# Patient Record
Sex: Female | Born: 1975 | Race: White | Hispanic: No | Marital: Married | Smoking: Never smoker
Health system: Southern US, Community
[De-identification: ages and names within clinical notes are randomized; demographics above are authoritative.]

## PROBLEM LIST (undated history)

## (undated) DIAGNOSIS — F909 Attention-deficit hyperactivity disorder, unspecified type: Secondary | ICD-10-CM

## (undated) HISTORY — PX: KIDNEY SURGERY: SHX687

---

## 2003-12-26 ENCOUNTER — Ambulatory Visit (HOSPITAL_COMMUNITY): Admission: AD | Admit: 2003-12-26 | Discharge: 2003-12-26 | Payer: Self-pay | Admitting: Obstetrics and Gynecology

## 2004-01-16 ENCOUNTER — Ambulatory Visit (HOSPITAL_COMMUNITY): Admission: AD | Admit: 2004-01-16 | Discharge: 2004-01-16 | Payer: Self-pay | Admitting: Obstetrics and Gynecology

## 2004-01-22 ENCOUNTER — Inpatient Hospital Stay (HOSPITAL_COMMUNITY): Admission: AD | Admit: 2004-01-22 | Discharge: 2004-01-26 | Payer: Self-pay | Admitting: Obstetrics and Gynecology

## 2004-08-07 ENCOUNTER — Ambulatory Visit: Payer: Self-pay | Admitting: Psychiatry

## 2004-08-11 ENCOUNTER — Ambulatory Visit: Payer: Self-pay | Admitting: Psychiatry

## 2007-01-26 ENCOUNTER — Ambulatory Visit (HOSPITAL_COMMUNITY): Admission: RE | Admit: 2007-01-26 | Discharge: 2007-01-26 | Payer: Self-pay | Admitting: Obstetrics and Gynecology

## 2016-04-29 ENCOUNTER — Emergency Department (HOSPITAL_COMMUNITY)
Admission: EM | Admit: 2016-04-29 | Discharge: 2016-04-29 | Disposition: A | Discharge status: Home / Self Care | Payer: Self-pay | Attending: Emergency Medicine | Admitting: Emergency Medicine

## 2016-04-29 ENCOUNTER — Encounter (HOSPITAL_COMMUNITY): Payer: Self-pay | Admitting: Emergency Medicine

## 2016-04-29 ENCOUNTER — Other Ambulatory Visit: Payer: Self-pay

## 2016-04-29 ENCOUNTER — Emergency Department (HOSPITAL_COMMUNITY): Payer: Self-pay

## 2016-04-29 DIAGNOSIS — B029 Zoster without complications: Secondary | ICD-10-CM | POA: Insufficient documentation

## 2016-04-29 LAB — CBC WITH DIFFERENTIAL/PLATELET
BASOS PCT: 0 %
Basophils Absolute: 0 10*3/uL (ref 0.0–0.1)
EOS ABS: 0.1 10*3/uL (ref 0.0–0.7)
EOS PCT: 1 %
HCT: 44 % (ref 36.0–46.0)
HEMOGLOBIN: 14.7 g/dL (ref 12.0–15.0)
LYMPHS ABS: 2.2 10*3/uL (ref 0.7–4.0)
Lymphocytes Relative: 33 %
MCH: 31.4 pg (ref 26.0–34.0)
MCHC: 33.4 g/dL (ref 30.0–36.0)
MCV: 94 fL (ref 78.0–100.0)
MONOS PCT: 5 %
Monocytes Absolute: 0.4 10*3/uL (ref 0.1–1.0)
NEUTROS PCT: 61 %
Neutro Abs: 4 10*3/uL (ref 1.7–7.7)
PLATELETS: 257 10*3/uL (ref 150–400)
RBC: 4.68 MIL/uL (ref 3.87–5.11)
RDW: 12.6 % (ref 11.5–15.5)
WBC: 6.7 10*3/uL (ref 4.0–10.5)

## 2016-04-29 LAB — BASIC METABOLIC PANEL
Anion gap: 5 (ref 5–15)
BUN: 20 mg/dL (ref 6–20)
CALCIUM: 8.7 mg/dL — AB (ref 8.9–10.3)
CO2: 25 mmol/L (ref 22–32)
CREATININE: 0.76 mg/dL (ref 0.44–1.00)
Chloride: 107 mmol/L (ref 101–111)
Glucose, Bld: 92 mg/dL (ref 65–99)
Potassium: 4.4 mmol/L (ref 3.5–5.1)
SODIUM: 137 mmol/L (ref 135–145)

## 2016-04-29 LAB — I-STAT TROPONIN, ED: TROPONIN I, POC: 0 ng/mL (ref 0.00–0.08)

## 2016-04-29 MED ORDER — IBUPROFEN 400 MG PO TABS
600.0000 mg | ORAL_TABLET | Freq: Once | ORAL | Status: AC
  Administered 2016-04-29: 600 mg via ORAL
  Filled 2016-04-29: qty 2

## 2016-04-29 MED ORDER — TRAMADOL HCL 50 MG PO TABS: 50.0000 mg | ORAL_TABLET | Freq: Four times a day (QID) | ORAL | Status: DC | PRN

## 2016-04-29 MED ORDER — ACYCLOVIR 800 MG PO TABS: 800.0000 mg | ORAL_TABLET | Freq: Every day | ORAL | Status: DC

## 2016-04-29 NOTE — ED Notes (Signed)
Pt c/o LT scapula pain that radiates down LT arm and into neck. Pt states that she had muscle spasms in her arm all day yesterday. Pt now reports that LT hand intermittently becomes numb. Denies any CP. Denies recent injury.

## 2016-04-29 NOTE — ED Provider Notes (Signed)
Medical screening examination/treatment/procedure(s) were conducted as a shared visit with non-physician practitioner(s) and myself.  I personally evaluated the patient during the encounter.   EKG Interpretation   Date/Time:  Thursday April 29 2016 09:09:18 EDT Ventricular Rate:  88 PR Interval:    QRS Duration: 80 QT Interval:  350 QTC Calculation: 424 R Axis:   63 Text Interpretation:  Sinus rhythm Low voltage, precordial leads No acute  ischemic changes. No prior EKG for comparison  Confirmed by Atley Scarboro MD, Guilianna Mckoy  [phone]) on 04/29/2016 9:52:5925 AM      40 year old female, otherwise healthy, who presents with left scapular pain. States that she woke up with pain underneath her left shoulder blade yesterday morning, that has gradually worsened throughout the course of the past day. Pain constant, burning and stinging sensation, and extremely sensitive to touch. She also noticed a small blister over the area yesterday. Not associated with shortness of breath, diaphoresis, syncope or near syncope. Pain radiates down her left arm and up to her neck. Pain worse with movement and palpation. She is well-appearing in no acute distress on presentation. She has hyperesthetic skin over the left scapula, and there are 3 erythematous lesions suggestive of likely early shingles. She has normal EKG, normal x-ray, and unremarkable troponin and basic blood work. She will be treated for shingles. Strict return and follow-up instructions reviewed. She/He expressed understanding of all discharge instructions and felt comfortable with the plan of care.   Lavera Guiseana Duo Thia Olesen, MD 04/29/16 1013

## 2016-04-29 NOTE — Discharge Instructions (Signed)

## 2016-04-29 NOTE — ED Provider Notes (Signed)
CSN: [id number]     Arrival date & time 04/29/16  [id number] History   First MD Initiated Contact with Patient 04/29/16 0848     Chief Complaint  Patient presents with  . Shoulder Pain     (Consider location/radiation/quality/duration/timing/severity/associated sxs/prior Treatment) HPI   Sydney BarmanStephanie Salas is a 40 y.o. female who presents to the Emergency Department complaining of sudden onset of left scapular and neck pain that began yesterday.  She describes a burning, stinging pain to her left arm and along her shoulder blade that is worse with movement of the arm and to touch of her skin.  She notes having intermittent tingling and numbness to her left hand.  She noticed three small "bites" to her left back and states that one appeared to look like a blister and "popped" when she touched it.  She was unrelieved with flexeril.  She denies fever, headache, chest pain, shortness of breath, diaphoresis, N/V.   History reviewed. No pertinent past medical history. Past Surgical History  Procedure Laterality Date  . Cesarean section    . Kidney surgery      infant   No family history on file. Social History  Substance Use Topics  . Smoking status: Never Smoker   . Smokeless tobacco: None  . Alcohol Use: No   OB History    No data available     Review of Systems  Constitutional: Negative for fever and chills.  Eyes: Negative for visual disturbance.  Respiratory: Negative for chest tightness and shortness of breath.   Cardiovascular: Negative for chest pain.  Gastrointestinal: Negative for nausea, vomiting and abdominal pain.  Genitourinary: Negative for dysuria and difficulty urinating.  Musculoskeletal: Positive for myalgias, back pain (left scapular pain) and neck pain. Negative for joint swelling.  Skin: Positive for rash. Negative for color change and wound.  Neurological: Positive for numbness (intermittent tingling and numbness of the left hand). Negative for dizziness and  headaches.  All other systems reviewed and are negative.     Allergies  Review of patient's allergies indicates no known allergies.  Home Medications   Prior to Admission medications   Not on File   BP 131/86 mmHg  Pulse 102  Temp(Src) 98.1 F (36.7 C) (Oral)  Resp 18  Ht 5' 5.5" (1.664 m)  Wt 86.183 kg  BMI 31.13 kg/m2  SpO2 100%  LMP 03/29/2016 Physical Exam  Constitutional: She is oriented to person, place, and time. She appears well-developed and well-nourished. No distress.  HENT:  Head: Normocephalic and atraumatic.  Neck: Normal range of motion. Neck supple. No thyromegaly present.  Cardiovascular: Normal rate, regular rhythm and intact distal pulses.   No murmur heard. Pulmonary/Chest: Effort normal and breath sounds normal. No respiratory distress. She exhibits no tenderness.  Musculoskeletal: Normal range of motion. She exhibits tenderness. She exhibits no edema.  Tenderness to palpation along the left scapular border and left rhomboid and trapezius muscles.  Radial pulse is brisk, distal sensation intact, CR< 2 sec. Grip strength is strong and symmetrical.   No midline tenderness.    Lymphadenopathy:    She has no cervical adenopathy.  Neurological: She is alert and oriented to person, place, and time. She has normal strength. No sensory deficit. She exhibits normal muscle tone. Coordination normal.  Reflex Scores:      Tricep reflexes are 1+ on the right side and 2+ on the left side.      Bicep reflexes are 1+ on the right side and 2+ on  the left side. Skin: Skin is warm and dry.  Three erythematous lesions along the left mid back. One lesion appears to have been de-roofed.    Nursing note and vitals reviewed.   ED Course  Procedures (including critical care time) Labs Review Labs Reviewed  BASIC METABOLIC PANEL - Abnormal; Notable for the following:    Calcium 8.7 (*)    All other components within normal limits  CBC WITH DIFFERENTIAL/PLATELET  I-STAT  TROPOININ, ED    Imaging Review Dg Chest 2 View  04/29/2016  CLINICAL DATA:  Pain about the left shoulder blade and left arm which began on waking up yesterday morning. No known injury. EXAM: CHEST  2 VIEW COMPARISON:  None. FINDINGS: The lobes are clear. Heart size is normal. No pneumothorax or pleural effusion. No bony abnormality. IMPRESSION: Normal chest. Electronically Signed   By: Drusilla Kannerhomas  Dalessio M.D.   On: 04/29/2016 10:01   Dg Shoulder Left  04/29/2016  CLINICAL DATA:  Woke yesterday with pain around left shoulder. EXAM: LEFT SHOULDER - 2+ VIEW COMPARISON:  None. FINDINGS: There is no evidence of fracture or dislocation. There is no evidence of arthropathy or other focal bone abnormality. Soft tissues are unremarkable. IMPRESSION: Negative. Electronically Signed   By: Signa Kellaylor  Stroud M.D.   On: 04/29/2016 10:00   I have personally reviewed and evaluated these images and lab results as part of my medical decision-making.   EKG Interpretation   Date/Time:  Thursday April 29 2016 09:09:18 EDT Ventricular Rate:  88 PR Interval:    QRS Duration: 80 QT Interval:  350 QTC Calculation: 424 R Axis:   63 Text Interpretation:  Sinus rhythm Low voltage, precordial leads No acute  ischemic changes. No prior EKG for comparison  Confirmed by LIU MD, DANA  [phone]([id number]) on 04/29/2016 9:52:25 AM      MDM   Final diagnoses:  Herpes zoster   Pt also seen by Dr. Verdie MosherLiu and care plan discussed    Sx's c/w with herpes zoster.  Cardiac work-up reassuring.  Pt appears stable for d/c, agrees to PMD f/u for recheck.    New Prescriptions   ACYCLOVIR (ZOVIRAX) 800 MG TABLET    Take 1 tablet (800 mg total) by mouth 5 (five) times daily. For 7 days   TRAMADOL (ULTRAM) 50 MG TABLET    Take 1 tablet (50 mg total) by mouth every 6 (six) hours as needed.       Pauline Ausammy Aurelius Gildersleeve, PA-C 04/29/16 1029  Lavera Guiseana Duo Liu, MD 04/29/16 [phone]

## 2018-06-01 ENCOUNTER — Other Ambulatory Visit: Payer: Self-pay

## 2018-06-01 ENCOUNTER — Encounter (HOSPITAL_COMMUNITY): Payer: Self-pay

## 2018-06-01 ENCOUNTER — Emergency Department (HOSPITAL_COMMUNITY): Payer: Self-pay

## 2018-06-01 ENCOUNTER — Emergency Department (HOSPITAL_COMMUNITY)
Admission: EM | Admit: 2018-06-01 | Discharge: 2018-06-01 | Disposition: A | Discharge status: Home / Self Care | Payer: Self-pay | Attending: Emergency Medicine | Admitting: Emergency Medicine

## 2018-06-01 DIAGNOSIS — Y9389 Activity, other specified: Secondary | ICD-10-CM | POA: Insufficient documentation

## 2018-06-01 DIAGNOSIS — M25551 Pain in right hip: Secondary | ICD-10-CM | POA: Insufficient documentation

## 2018-06-01 DIAGNOSIS — Y998 Other external cause status: Secondary | ICD-10-CM | POA: Insufficient documentation

## 2018-06-01 DIAGNOSIS — S32009A Unspecified fracture of unspecified lumbar vertebra, initial encounter for closed fracture: Secondary | ICD-10-CM | POA: Insufficient documentation

## 2018-06-01 DIAGNOSIS — Y9289 Other specified places as the place of occurrence of the external cause: Secondary | ICD-10-CM | POA: Insufficient documentation

## 2018-06-01 DIAGNOSIS — W1789XA Other fall from one level to another, initial encounter: Secondary | ICD-10-CM | POA: Insufficient documentation

## 2018-06-01 HISTORY — DX: Attention-deficit hyperactivity disorder, unspecified type: F90.9

## 2018-06-01 LAB — POC URINE PREG, ED: Preg Test, Ur: NEGATIVE

## 2018-06-01 MED ORDER — CYCLOBENZAPRINE HCL 10 MG PO TABS: 10.0000 mg | ORAL_TABLET | Freq: Three times a day (TID) | ORAL | 0 refills | Status: DC

## 2018-06-01 MED ORDER — HYDROCODONE-ACETAMINOPHEN 5-325 MG PO TABS
2.0000 | ORAL_TABLET | Freq: Once | ORAL | Status: AC
  Administered 2018-06-01: 2 via ORAL
  Filled 2018-06-01: qty 2

## 2018-06-01 MED ORDER — HYDROCODONE-ACETAMINOPHEN 5-325 MG PO TABS: 1.0000 | ORAL_TABLET | ORAL | 0 refills | Status: DC | PRN

## 2018-06-01 MED ORDER — IBUPROFEN 600 MG PO TABS: 600.0000 mg | ORAL_TABLET | Freq: Four times a day (QID) | ORAL | 0 refills | Status: DC

## 2018-06-01 MED ORDER — ONDANSETRON HCL 4 MG PO TABS
4.0000 mg | ORAL_TABLET | Freq: Once | ORAL | Status: AC
  Administered 2018-06-01: 4 mg via ORAL
  Filled 2018-06-01: qty 1

## 2018-06-01 MED ORDER — CYCLOBENZAPRINE HCL 10 MG PO TABS
10.0000 mg | ORAL_TABLET | Freq: Once | ORAL | Status: AC
  Administered 2018-06-01: 10 mg via ORAL
  Filled 2018-06-01: qty 1

## 2018-06-01 NOTE — ED Triage Notes (Signed)
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 Fell 536ft onto concrete on right side of body. A lot of pain on right side and lower back pain. NAD. Ambulatory with limp to triage.

## 2018-06-01 NOTE — ED Notes (Signed)
Pt returned from radiology.

## 2018-06-01 NOTE — ED Notes (Signed)
EDP at bedside  

## 2018-06-01 NOTE — ED Notes (Signed)
Pt fell approx 6 feet off a loading dock last night. Pt c/o RT hip/pelvic pain. Pt ambulatory. Bruising noted beneath RT ribcage. Lumbar and sacral tenderness with palpation. Generalized soreness reported.

## 2018-06-01 NOTE — ED Notes (Signed)
Patient transported to CT and xray 

## 2018-06-01 NOTE — ED Notes (Signed)
Pt aware a urine specimen is needed. Will notify staff when one can be obtained. 

## 2018-06-01 NOTE — Discharge Instructions (Addendum)
The x-ray of your hip and pelvis are negative for fracture or dislocation.  The CT scan of your lumbar spine show that you have 3 transverse processes that are broken on the right side. Please apply ice today and tomorrow.  A lumbar back support may be helpful.  Please use ibuprofen with breakfast, lunch, dinner, and at bedtime.  Please use Flexeril 3 times daily for spasm pain.  May use Norco for more severe pain.  Flexeril and Norco may cause drowsiness.  Please use these medications with caution.  Please call Dr. Sherlean FootLucey for evaluation concerning your fractures as soon as possible.

## 2018-06-01 NOTE — ED Provider Notes (Signed)
[facility] Provider Note   CSN: [id number] Arrival date & time: 06/01/18  1450     History   Chief Complaint Chief Complaint  Patient presents with  . Fall    HPI Sydney Salas is a 42 y.o. female.  Patient is a 42 year old female who presents to the emergency department following a fall on last evening.  The patient states that last evening approximately 2 or 3 PM she fell approximately 5 feet off of a loading dock, and hit a rail on her way down.  She sustained injury to the lower back, as well as the right hip area.  She had pain during the night.  She used baclofen tablet as well as ibuprofen.  She was eventually able to go to sleep, but woke up again this morning with pain.  She took another ibuprofen, but continues to have pain.  She has pain when she puts any weight on the right lower extremity.  She has pain when she moves the right hip and certain directions.  The patient denies hitting her head, injuring her neck, or any other of the extremities.  She has no problems with breathing.  She does have some mild soreness around the rib area, but no difficulty breathing, no cough, no hemoptysis, or related symptoms of trauma.  Patient denies being on any anticoagulation medications.  She has not had any previous operations or procedures involving her back or hip.  The history is provided by the patient.    Past Medical History:  Diagnosis Date  . ADHD     There are no active problems to display for this patient.   Past Surgical History:  Procedure Laterality Date  . CESAREAN SECTION    . KIDNEY SURGERY     infant     OB History   None      Home Medications    Prior to Admission medications   Medication Sig Start Date End Date Taking? Authorizing Provider  acyclovir (ZOVIRAX) 800 MG tablet Take 1 tablet (800 mg total) by mouth 5 (five) times daily. For 7 days 04/29/16   Triplett, Tammy, PA-C  traMADol (ULTRAM) 50 MG tablet Take 1 tablet (50  mg total) by mouth every 6 (six) hours as needed. 04/29/16   Triplett, Babette Relicammy, PA-C    Family History No family history on file.  Social History Social History   Tobacco Use  . Smoking status: Never Smoker  . Smokeless tobacco: Never Used  Substance Use Topics  . Alcohol use: No  . Drug use: No     Allergies   Patient has no known allergies.   Review of Systems Review of Systems  Constitutional: Negative for activity change.       All ROS Neg except as noted in HPI  HENT: Negative for nosebleeds.   Eyes: Negative for photophobia and discharge.  Respiratory: Negative for cough, shortness of breath and wheezing.   Cardiovascular: Negative for chest pain and palpitations.  Gastrointestinal: Negative for abdominal pain and blood in stool.  Genitourinary: Negative for dysuria, frequency and hematuria.  Musculoskeletal: Positive for arthralgias and back pain. Negative for neck pain.  Skin: Negative.   Neurological: Negative for dizziness, seizures and speech difficulty.  Psychiatric/Behavioral: Negative for confusion and hallucinations.     Physical Exam Updated Vital Signs BP 131/88 (BP Location: Right Arm)   Pulse 85   Temp 98.9 F (37.2 C) (Oral)   Resp 16   Ht 5\' 6"  (1.676 m)  Wt 86.2 kg (190 lb)   LMP 05/14/2018   SpO2 100%   BMI 30.67 kg/m   Physical Exam  Constitutional: She is oriented to person, place, and time. She appears well-developed and well-nourished.  Non-toxic appearance.  HENT:  Head: Normocephalic.  Right Ear: Tympanic membrane and external ear normal.  Left Ear: Tympanic membrane and external ear normal.  No scalp hematomas appreciated.  Eyes: Pupils are equal, round, and reactive to light. EOM and lids are normal.  Neck: Normal range of motion. Neck supple. Carotid bruit is not present.  Cardiovascular: Normal rate, regular rhythm, normal heart sounds, intact distal pulses and normal pulses.  Pulmonary/Chest: Breath sounds normal. No  respiratory distress.  There is mild soreness to the lower right and left rib area.  There is symmetrical rise and fall of the chest.  Patient speaks in complete sentences without problem.  Abdominal: Soft. Bowel sounds are normal. There is no tenderness. There is no guarding.  Musculoskeletal:       Right hip: She exhibits decreased range of motion and tenderness. She exhibits no deformity.       Legs: There is pain to movement of the pelvis.  There is pain with attempted range of motion of the right hip.  There is no shortening of the right lower extremity.  There is a bruise and abrasion just above the right buttocks.  There is good range of motion of the right knee, ankle, and toes.  There is no right calf swelling.  The Achilles tendon is intact.  The right dorsalis pedis pulses 2+.  There is no tenderness of the left lower extremity.  There is no tenderness of the right or left upper extremities.  There is no palpable step-off of the cervical, thoracic, or lumbar spine.  There is tenderness over the lower lumbar spine to palpation.  Lymphadenopathy:       Head (right side): No submandibular adenopathy present.       Head (left side): No submandibular adenopathy present.    She has no cervical adenopathy.  Neurological: She is alert and oriented to person, place, and time. She has normal strength. No cranial nerve deficit or sensory deficit. Coordination normal.  Skin: Skin is warm and dry.  Psychiatric: She has a normal mood and affect. Her speech is normal.  Nursing note and vitals reviewed.    ED Treatments / Results  Labs (all labs ordered are listed, but only abnormal results are displayed) Labs Reviewed - No data to display  EKG None  Radiology No results found.  Procedures Procedures (including critical care time)  Medications Ordered in ED Medications - No data to display   Initial Impression / Assessment and Plan / ED Course  I have reviewed the triage vital  signs and the nursing notes.  Pertinent labs & imaging results that were available during my care of the patient were reviewed by me and considered in my medical decision making (see chart for details).       Final Clinical Impressions(s) / ED Diagnoses MDM Patient fell about 5 feet on last evening. Vital signs reviewed.  Pulse oximetry is 100% on room air.  Within normal limits by my interpretation.  No gross neurologic or vascular deficits appreciated on examination. X-ray of the right hip and pelvis are negative for fracture or dislocation.  CT scan of the lumbar spine reveals 3 transverse process fractures of the lumbar spine.  I have discussed the fractures with the patient in  terms of which she understands.  I have asked the patient to obtain a lumbar support or back brace to assist with her comfort.  She has been given an ice pack to use.  Prescription for ibuprofen 4 times daily and Flexeril 3 times daily have been given to the patient.  Prescription for hydrocodone also given to the patient to use for more severe pain.  Patient is referred to Dr. Sherlean Foot in [place] at the patient's request for orthopedic management of the transverse process fractures.  Questions were answered.  Patient is in agreement with this plan.    Final diagnoses:  Closed fracture of transverse process of lumbar vertebra, initial encounter [facility])    ED Discharge Orders        Ordered    HYDROcodone-acetaminophen (NORCO/VICODIN) 5-325 MG tablet  Every 4 hours PRN     06/01/18 1834    cyclobenzaprine (FLEXERIL) 10 MG tablet  3 times daily     06/01/18 1834    ibuprofen (ADVIL,MOTRIN) 600 MG tablet  4 times daily     06/01/18 1834       Ivery Quale, PA-C 06/01/18 1849    Long, Arlyss Repress, MD 06/02/18 1445

## 2019-04-17 IMAGING — CT CT L SPINE W/O CM
3 series · 13 of 33 positions shown, 16 images · non-contrast
Comparison: None.

CLINICAL DATA: 6 foot fall last night. Fell on buttocks and right
side. Patient hit a rail. Injury to low back and right hip. Right
low back and lower extremity pain.

EXAM:
CT LUMBAR SPINE WITHOUT CONTRAST
TECHNIQUE: Multidetector CT imaging of the lumbar spine was performed without
intravenous contrast administration. Multiplanar CT image
reconstructions were also generated.

[Series 4: l spine soft · axial · 0.31mm/px · z∈[-453,-297]mm · 5 of 114 slices shown, 7 images]
[im 18/114  soft-tissue]
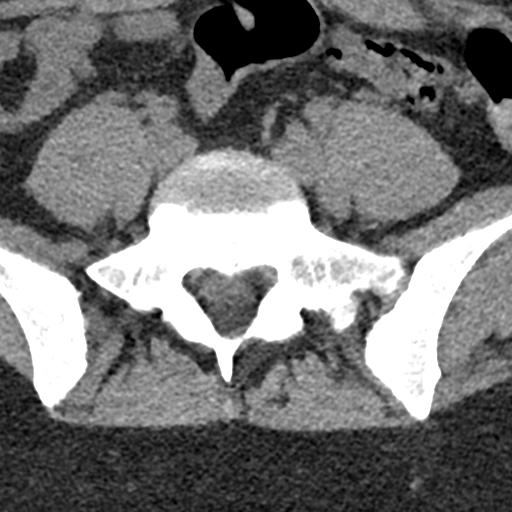
[im 18/114  bone]
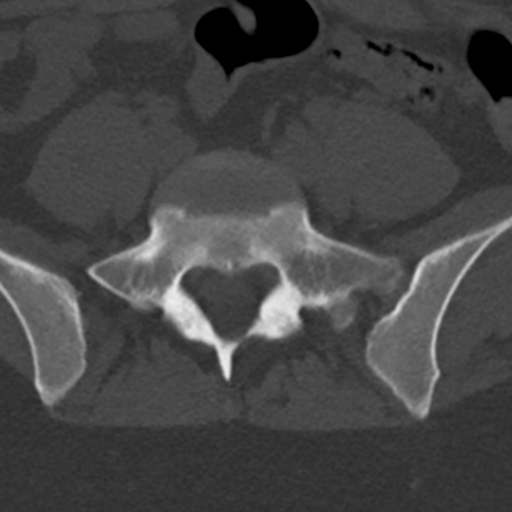
[im 35/114  bone]
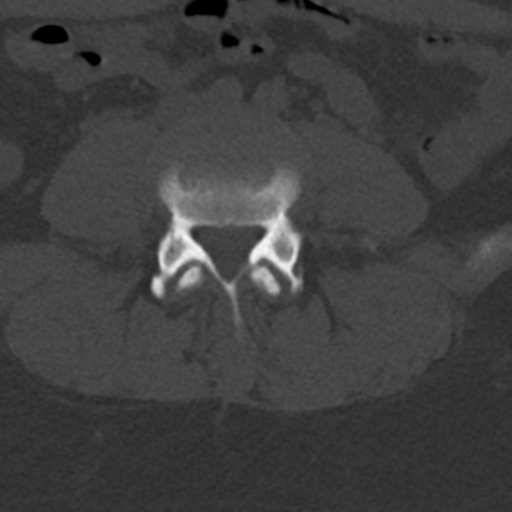
[im 61/114  bone]
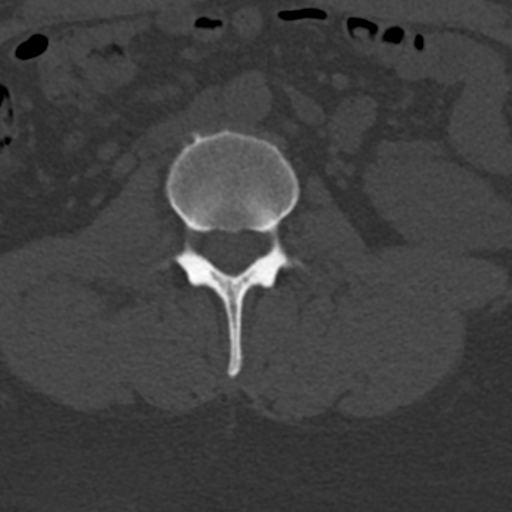
[im 79/114  bone]
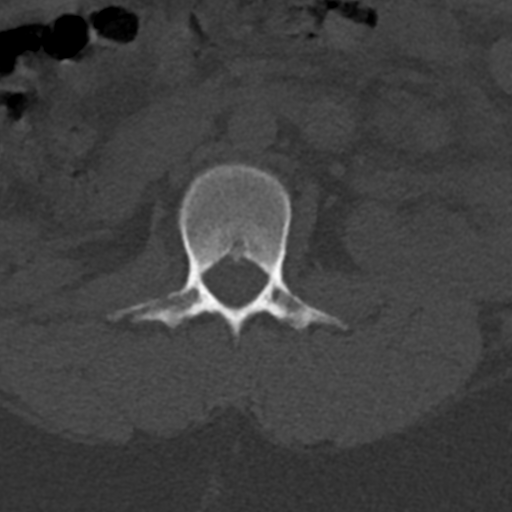
[im 96/114  soft-tissue]
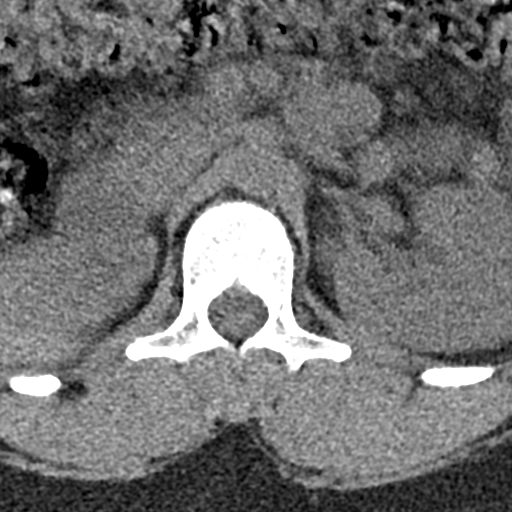
[im 96/114  bone]
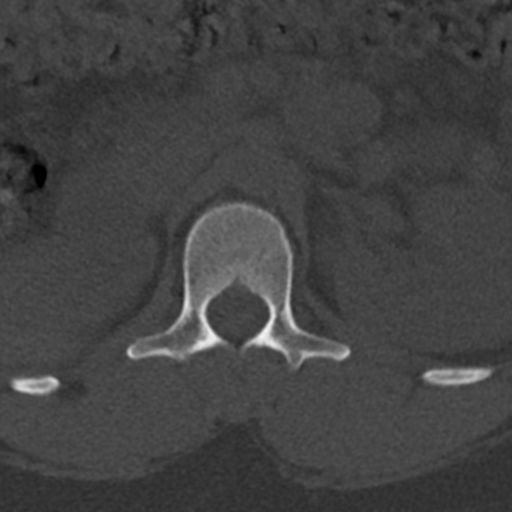

[Series 5: sagittal bone · sagittal · 0.33mm/px · 5 of 67 slices shown, 6 images]
[im 23/67  bone]
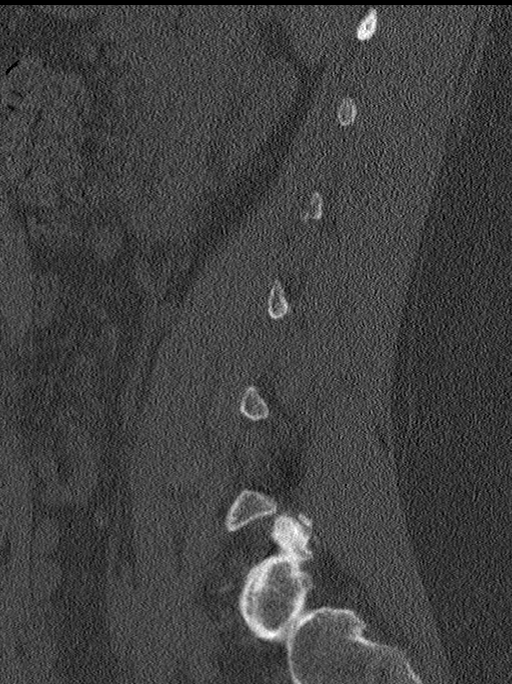
[im 28/67  bone]
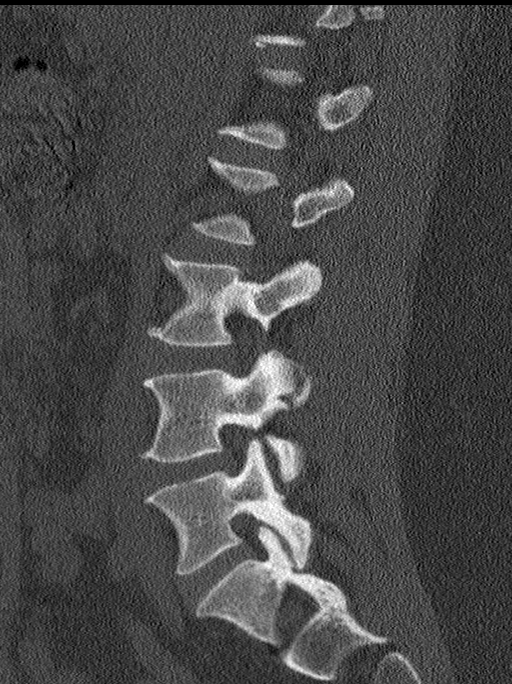
[im 34/67  soft-tissue]
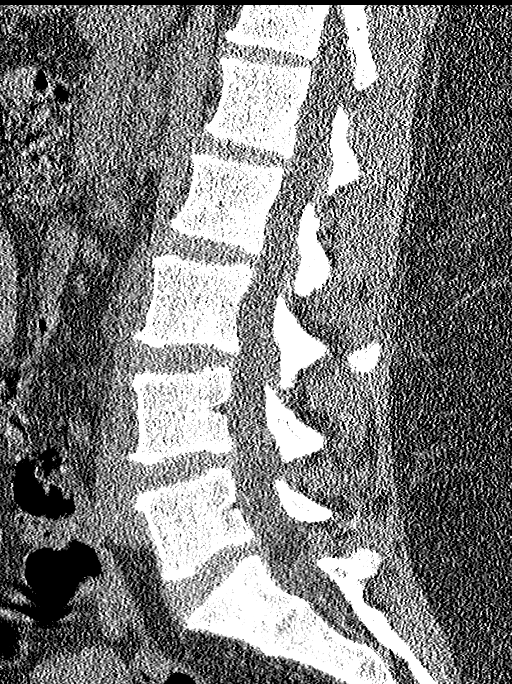
[im 34/67  bone]
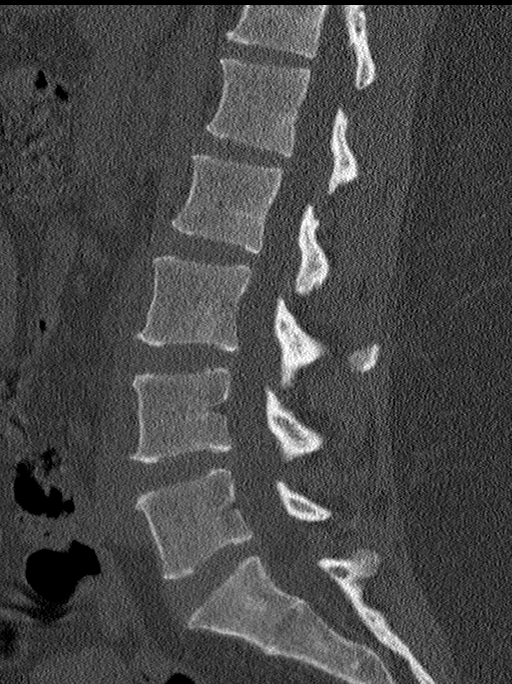
[im 39/67  bone]
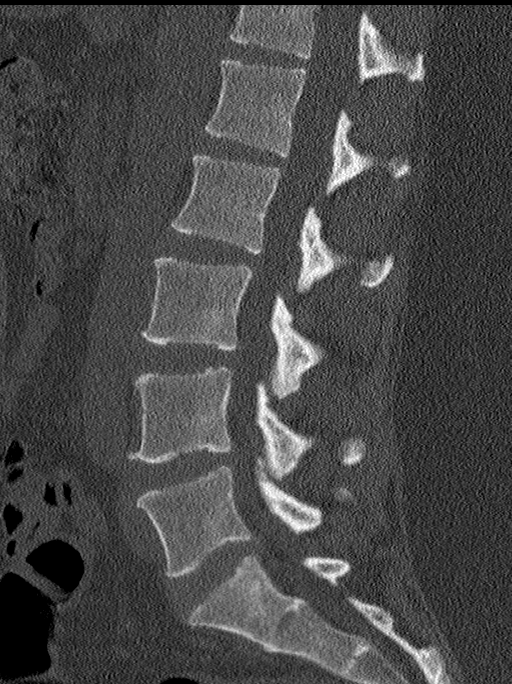
[im 45/67  bone]
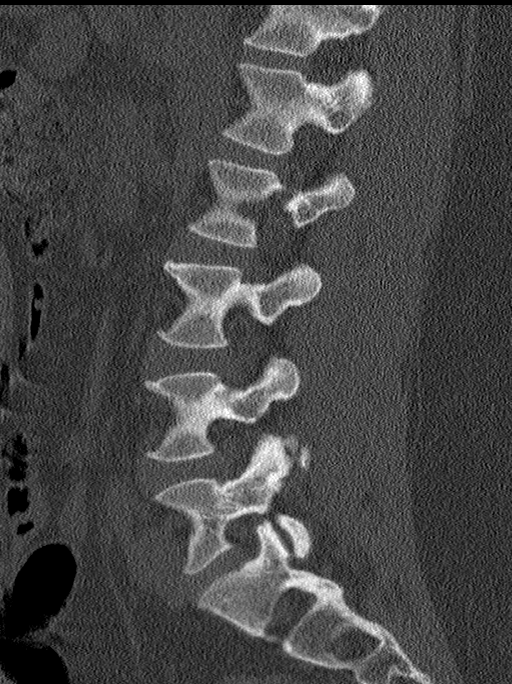

[Series 6: coronal bone · coronal · 0.33mm/px · 3 of 69 slices shown]
[im 14/69  bone]
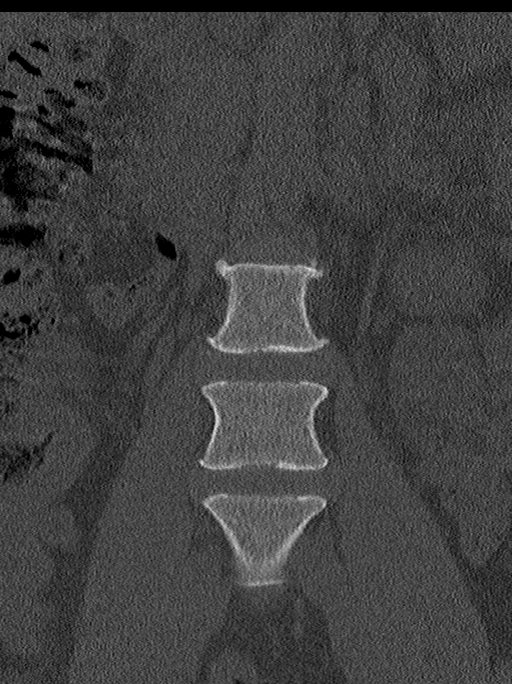
[im 28/69  bone]
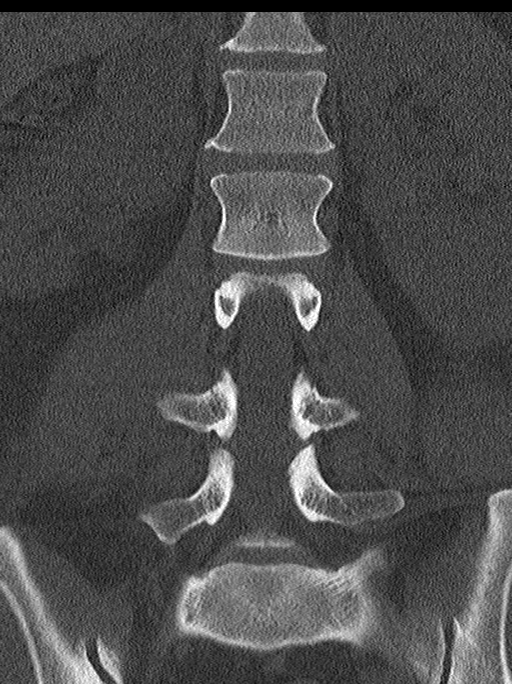
[im 41/69  bone]
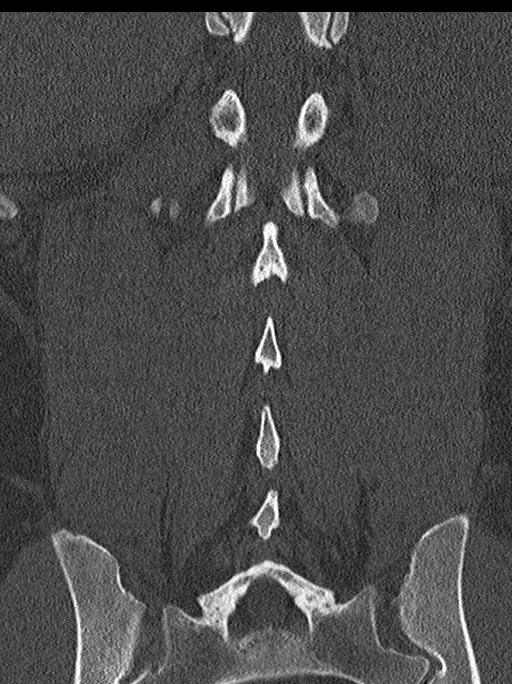

[13 of 33 positions shown; findings below may reference images not displayed]

FINDINGS: Segmentation: 5 non rib-bearing lumbar type vertebral bodies are
present. The lowest fully formed vertebral body is L5.

Alignment: AP alignment is anatomic.

Vertebrae: Minimally displaced spinous process fractures are present
on the right at L2, L3, and L4. Vertebral body heights are
maintained. No additional fractures are present.

Paraspinal and other soft tissues: Right kidney is atrophic.
Nonobstructing stone is present at the upper pole. Visualized left
kidney is within normal limits. No focal soft tissue lesions are
present. There is no significant adenopathy.

Disc levels: No significant focal disc protrusion or stenosis is
evident.
IMPRESSION: 1. Minimally displaced right transverse process fractures at L2, L3,
and L4.
2. No additional trauma.
3. Atrophy of the right kidney.

These results were called by telephone at the time of interpretation
on 06/01/2018 at [DATE] to ANGEL MARIO PINZON , who verbally acknowledged
these results.

## 2020-11-03 ENCOUNTER — Ambulatory Visit
Admission: EM | Admit: 2020-11-03 | Discharge: 2020-11-03 | Disposition: A | Discharge status: Home / Self Care | Payer: Self-pay | Attending: Internal Medicine | Admitting: Internal Medicine

## 2020-11-03 DIAGNOSIS — M79605 Pain in left leg: Secondary | ICD-10-CM

## 2020-11-03 DIAGNOSIS — T792XXA Traumatic secondary and recurrent hemorrhage and seroma, initial encounter: Secondary | ICD-10-CM

## 2020-11-03 NOTE — Discharge Instructions (Signed)
Elevation of the left leg Icing of the left leg Take NSAIDs If you notice any drainage, redness or worsening pain please return to the urgent care to be reevaluated.

## 2020-11-03 NOTE — ED Triage Notes (Signed)
Pt injured right leg 2 weeks ago missing step, has knot on right lower shin, no trouble ambulating

## 2020-11-05 NOTE — ED Provider Notes (Signed)
[facility]    CSN: [id number] Arrival date & time: 11/03/20  1915      History   Chief Complaint Chief Complaint  Patient presents with  . Leg Injury    HPI Sydney Salas is a 44 y.o. female comes to the urgent care with complaints of right leg pain which is worsened over the past couple of days.  Patient injured her right leg about 2 weeks ago.  She hit her right leg against an object.  At the time of the injury she had significant bruising to the distal aspect of her right leg.  Over the past couple of days she has noticed a firm swelling on the anterior aspect of the distal right leg.  Swelling got particularly more painful today.  Pain was constant, throbbing, aggravated by palpation and without any known relieving factors.  No overlying erythema.  No discharge from the area.  No fever or chills.   HPI  Past Medical History:  Diagnosis Date  . ADHD     There are no problems to display for this patient.   Past Surgical History:  Procedure Laterality Date  . CESAREAN SECTION    . KIDNEY SURGERY     infant    OB History   No obstetric history on file.      Home Medications    Prior to Admission medications   Medication Sig Start Date End Date Taking? Authorizing Provider  acyclovir (ZOVIRAX) 800 MG tablet Take 1 tablet (800 mg total) by mouth 5 (five) times daily. For 7 days 04/29/16   Triplett, Tammy, PA-C  cyclobenzaprine (FLEXERIL) 10 MG tablet Take 1 tablet (10 mg total) by mouth 3 (three) times daily. 06/01/18   Ivery Quale, PA-C  ibuprofen (ADVIL,MOTRIN) 600 MG tablet Take 1 tablet (600 mg total) by mouth 4 (four) times daily. 06/01/18   Ivery Quale, PA-C    Family History History reviewed. No pertinent family history.  Social History Social History   Tobacco Use  . Smoking status: Never Smoker  . Smokeless tobacco: Never Used  Substance Use Topics  . Alcohol use: No  . Drug use: No     Allergies   Patient has no known  allergies.   Review of Systems Review of Systems  Genitourinary: Negative.   Musculoskeletal: Negative for arthralgias and joint swelling.  Skin: Negative for color change, pallor and rash.  Neurological: Negative.      Physical Exam Triage Vital Signs ED Triage Vitals [11/03/20 1922]  Enc Vitals Group     BP 134/89     Pulse Rate 90     Resp 16     Temp 98.4 F (36.9 C)     Temp src      SpO2 98 %     Weight      Height      Head Circumference      Peak Flow      Pain Score      Pain Loc      Pain Edu?      Excl. in GC?    No data found.  Updated Vital Signs BP 134/89   Pulse 90   Temp 98.4 F (36.9 C)   Resp 16   LMP  (LMP Unknown)   SpO2 98%   Visual Acuity Right Eye Distance:   Left Eye Distance:   Bilateral Distance:    Right Eye Near:   Left Eye Near:    Bilateral Near:  Physical Exam Vitals and nursing note reviewed.  Cardiovascular:     Pulses: Normal pulses.     Heart sounds: Normal heart sounds.  Pulmonary:     Effort: Pulmonary effort is normal.     Breath sounds: Normal breath sounds.  Musculoskeletal:        General: Normal range of motion.     Comments: Firm tender swelling over the anterior aspect of the distal tibia.  No overlying erythema.  No discharge noted.      UC Treatments / Results  Labs (all labs ordered are listed, but only abnormal results are displayed) Labs Reviewed - No data to display  EKG   Radiology No results found.  Procedures Procedures (including critical care time)  Medications Ordered in UC Medications - No data to display  Initial Impression / Assessment and Plan / UC Course  I have reviewed the triage vital signs and the nursing notes.  Pertinent labs & imaging results that were available during my care of the patient were reviewed by me and considered in my medical decision making (see chart for details).     1.  Traumatic seroma of the right leg: Over-the-counter medication for  pain Elevate right leg Wear loose pants If you notice any redness, discharge please return to the urgent care to be reevaluated I expect this to resolve over time. Final Clinical Impressions(s) / UC Diagnoses   Final diagnoses:  Left leg pain  Seroma due to trauma [facility])     Discharge Instructions     Elevation of the left leg Icing of the left leg Take NSAIDs If you notice any drainage, redness or worsening pain please return to the urgent care to be reevaluated.   ED Prescriptions    None     PDMP not reviewed this encounter.   Merrilee Jansky, MD 11/05/20 [phone]

## 2024-07-25 ENCOUNTER — Ambulatory Visit
Admission: RE | Admit: 2024-07-25 | Discharge: 2024-07-25 | Disposition: A | Discharge status: Home / Self Care | Payer: Self-pay | Source: Ambulatory Visit | Attending: Nurse Practitioner | Admitting: Nurse Practitioner

## 2024-07-25 VITALS — BP 143/99 | HR 96 | Temp 98.0°F | Resp 17

## 2024-07-25 DIAGNOSIS — I10 Essential (primary) hypertension: Secondary | ICD-10-CM

## 2024-07-25 DIAGNOSIS — R519 Headache, unspecified: Secondary | ICD-10-CM

## 2024-07-25 MED ORDER — BUTALBITAL-APAP-CAFFEINE 50-325-40 MG PO TABS: 1.0000 | ORAL_TABLET | Freq: Four times a day (QID) | ORAL | 0 refills | Status: AC | PRN

## 2024-07-25 MED ORDER — LISINOPRIL-HYDROCHLOROTHIAZIDE 10-12.5 MG PO TABS: 1.0000 | ORAL_TABLET | Freq: Every day | ORAL | 0 refills | Status: AC

## 2024-07-25 MED ORDER — POTASSIUM CHLORIDE ER 10 MEQ PO TBCR: 10.0000 meq | EXTENDED_RELEASE_TABLET | Freq: Every day | ORAL | 0 refills | Status: AC

## 2024-07-25 NOTE — Discharge Instructions (Addendum)
 You were seen today for elevated blood pressure readings consistent with stage 2 hypertension. Your blood pressure was 143/99 in clinic and has been elevated on other recent checks. You are starting lisinopril  10 mg combined with hydrochlorothiazide  12.5 mg once daily. Take this in the morning to help avoid nighttime urination. A potassium supplement was prescribed to help balance electrolytes, and Fioricet was provided to use as needed for headache relief.  To help manage your blood pressure, continue healthy habits such as limiting salt, watching your weight, staying physically active, and limiting caffeine . When checking your blood pressure at home, do so at the same time each day, in a calm environment, with your feet flat on the floor and using the proper cuff size. Keep a log of your readings and bring both the log and your home cuff to your primary care visit scheduled for September 26. Your provider will also review baseline labs to monitor kidney and liver function, electrolytes, thyroid, and cholesterol.  Seek follow-up with your primary care provider as scheduled or sooner if your blood pressure remains consistently elevated despite medication. Go to the emergency department right away if you develop severe headache, chest pain, shortness of breath, vision changes, dizziness, or any sudden concerning symptoms.

## 2024-07-25 NOTE — ED Triage Notes (Signed)
 Pt presents due to high blood pressure for about 2 weeks. States she has not been feeling great and has been having headaches.

## 2024-07-25 NOTE — ED Provider Notes (Signed)
 [facility]    CSN: [id number] Arrival date & time: 07/25/24  0827      History   Chief Complaint Chief Complaint  Patient presents with   Hypertension    HPI Sydney Salas is a 48 y.o. female.   Discussed the use of AI scribe software for clinical note transcription with the patient, who gave verbal consent to proceed.   Patient presents with concerns about elevated blood pressure and a recent headache. She has a history of borderline hypertension that she has been managing with lifestyle modifications. The patient reports noticing high blood pressure about a year and a half ago, with readings of 138/89. She implemented lifestyle changes, including reducing salt intake and losing approximately 15 pounds, which improved her blood pressure to around 125/85. However, about two weeks ago, she experienced a headache that she initially attributed to sinus issues. A subsequent blood pressure check at the dentist's office revealed a reading of 145/95.  Patient describes the headache as pounding in her ears, without associated cough, congestion, runny nose, blurred vision, or seeing spots. She denies nausea, palpitations, leg swelling, chest pain, shortness of breath, numbness, or tingling. The patient reports no new stressors or changes in sleep patterns.  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 Review of her blood pressure readings over the past 5 years at urgent care and ED visits reveals readings of 131/86 in June 2017, 131/88 in 2019, and 134/89 in 2021. Patient acknowledges that she has been active recently but expresses concern about her rising blood pressure despite her efforts. She has an appointment scheduled with her primary care physician at the [facility] next week (08/03/24) but was concerned about her blood pressure and wants evaluation sooner.    The patient doesn't smoke. She has no diagnosed medication conditions and does not take any prescription medications. She denies previus history of being diagnosed with hypertension She has a family history of hypertension.   The following sections of the patient's history were reviewed and updated as appropriate: allergies, current medications, past family history, past medical history, past social history, past surgical history, and problem list.     Past Medical History:  Diagnosis Date   ADHD     There are no active problems to display for this patient.   Past Surgical History:  Procedure Laterality Date   CESAREAN SECTION     KIDNEY SURGERY     infant    OB History   No obstetric history on file.      Home Medications    Prior to Admission medications   Medication Sig Start Date End Date Taking? Authorizing Provider  butalbital -acetaminophen -caffeine  (FIORICET) 50-325-40 MG tablet Take 1 tablet by mouth every 6 (six) hours as needed for headache. Do not to exceed 6 tablets per 24 hour period 07/25/24 07/25/25 Yes Crystelle Ferrufino, FNP  lisinopril -hydrochlorothiazide  (ZESTORETIC ) 10-12.5 MG tablet Take 1 tablet by mouth daily with breakfast. 07/25/24  Yes Iola Lukes, FNP  potassium chloride  (KLOR-CON ) 10 MEQ tablet Take 1 tablet (10 mEq total) by mouth daily. 07/25/24  Yes Iola Lukes, FNP    Family History History reviewed. No pertinent family history.  Social History Social History   Tobacco Use   Smoking status: Never   Smokeless tobacco: Never  Substance Use Topics   Alcohol use: No   Drug use: No  Allergies   Patient has no known allergies.   Review of Systems Review of Systems  Constitutional:  Positive for fatigue (mild).  HENT: Negative.    Eyes:  Negative for visual disturbance.  Respiratory: Negative.  Negative for shortness of breath.   Cardiovascular:  Negative for chest pain, palpitations and leg swelling.   Gastrointestinal:  Positive for nausea (very mild).  Neurological:  Positive for headaches. Negative for numbness.  Psychiatric/Behavioral:  Negative for sleep disturbance.   All other systems reviewed and are negative.    Physical Exam Triage Vital Signs ED Triage Vitals  Encounter Vitals Group     BP 07/25/24 0834 (!) 143/99     Girls Systolic BP Percentile --      Girls Diastolic BP Percentile --      Boys Systolic BP Percentile --      Boys Diastolic BP Percentile --      Pulse Rate 07/25/24 0834 96     Resp 07/25/24 0834 17     Temp 07/25/24 0834 98 F (36.7 C)     Temp Source 07/25/24 0833 Oral     SpO2 07/25/24 0834 98 %     Weight --      Height --      Head Circumference --      Peak Flow --      Pain Score 07/25/24 0837 0     Pain Loc --      Pain Education --      Exclude from Growth Chart --    No data found.  Updated Vital Signs BP (!) 143/99 (BP Location: Right Arm)   Pulse 96   Temp 98 F (36.7 C) (Oral)   Resp 17   LMP 06/24/2024 (Approximate)   SpO2 98%   Visual Acuity Right Eye Distance:   Left Eye Distance:   Bilateral Distance:    Right Eye Near:   Left Eye Near:    Bilateral Near:     Physical Exam Vitals reviewed.  Constitutional:      General: She is awake. She is not in acute distress.    Appearance: Normal appearance. She is well-developed. She is not ill-appearing, toxic-appearing or diaphoretic.  HENT:     Head: Normocephalic.     Right Ear: Hearing normal.     Left Ear: Hearing normal.     Nose: Nose normal.     Mouth/Throat:     Mouth: Mucous membranes are moist.  Eyes:     General: Vision grossly intact.     Extraocular Movements: Extraocular movements intact.     Right eye: No nystagmus.     Left eye: No nystagmus.     Conjunctiva/sclera: Conjunctivae normal.     Pupils: Pupils are equal, round, and reactive to light.  Neck:     Trachea: Trachea normal.  Cardiovascular:     Rate and Rhythm: Normal rate and  regular rhythm.     Pulses: Normal pulses.     Heart sounds: Normal heart sounds.  Pulmonary:     Effort: Pulmonary effort is normal.     Breath sounds: Normal breath sounds and air entry.  Abdominal:     Palpations: Abdomen is soft.  Musculoskeletal:        General: Normal range of motion.     Cervical back: Full passive range of motion without pain, normal range of motion and neck supple. No rigidity or tenderness.     Right lower leg: No edema.  Left lower leg: No edema.  Lymphadenopathy:     Cervical: No cervical adenopathy.  Skin:    General: Skin is warm and dry.  Neurological:     General: No focal deficit present.     Mental Status: She is alert and oriented to person, place, and time.     Cranial Nerves: No cranial nerve deficit.     Sensory: Sensation is intact. No sensory deficit.     Motor: Motor function is intact. No weakness.     Coordination: Coordination is intact.     Gait: Gait is intact.  Psychiatric:        Attention and Perception: Attention and perception normal.        Mood and Affect: Mood and affect normal.        Speech: Speech normal.        Behavior: Behavior is cooperative.        Thought Content: Thought content normal.        Cognition and Memory: Cognition normal.        Judgment: Judgment normal.      UC Treatments / Results  Labs (all labs ordered are listed, but only abnormal results are displayed) Labs Reviewed - No data to display  EKG   Radiology No results found.  Procedures Procedures (including critical care time)  Medications Ordered in UC Medications - No data to display  Initial Impression / Assessment and Plan / UC Course  I have reviewed the triage vital signs and the nursing notes.  Pertinent labs & imaging results that were available during my care of the patient were reviewed by me and considered in my medical decision making (see chart for details).     Patient presents with persistently elevated blood  pressure readings consistent with stage 2 hypertension, including 145/95 at a recent dental visit and 143/99 in clinic today. History notable for borderline hypertension since 2017 with gradual progression despite prior lifestyle success in lowering readings. Current symptoms include persistent headache without visual changes, chest pain, or shortness of breath. Initiated combination of lisinopril  10 mg with hydrochlorothiazide  12.5 mg daily, with instructions on proper timing and monitoring for side effects. Potassium chloride  supplementation prescribed to reduce risk of hypokalemia. Fioricet provided as needed for headache relief. Patient counseled to continue lifestyle modifications including weight management, salt reduction, limiting caffeine , and maintaining regular physical activity. Advised to check blood pressure daily, maintain a log, and bring cuff for comparison at upcoming primary care appointment on September 26th, where further evaluation including baseline labs will be discussed. Patient instructed to follow up with primary care as scheduled and to seek urgent or emergency care if blood pressure worsens or if severe headache, chest pain, visual changes, dizziness, or other concerning symptoms develop.  Today's evaluation has revealed no signs of a dangerous process. Discussed diagnosis with patient and/or guardian. Patient and/or guardian aware of their diagnosis, possible red flag symptoms to watch out for and need for close follow up. Patient and/or guardian understands verbal and written discharge instructions. Patient and/or guardian comfortable with plan and disposition.  Patient and/or guardian has a clear mental status at this time, good insight into illness (after discussion and teaching) and has clear judgment to make decisions regarding their care  Documentation was completed with the aid of voice recognition software. Transcription may contain typographical errors.  Final Clinical  Impressions(s) / UC Diagnoses   Final diagnoses:  Stage 2 hypertension  Acute intractable headache, unspecified headache type  Discharge Instructions      You were seen today for elevated blood pressure readings consistent with stage 2 hypertension. Your blood pressure was 143/99 in clinic and has been elevated on other recent checks. You are starting lisinopril  10 mg combined with hydrochlorothiazide  12.5 mg once daily. Take this in the morning to help avoid nighttime urination. A potassium supplement was prescribed to help balance electrolytes, and Fioricet was provided to use as needed for headache relief.  To help manage your blood pressure, continue healthy habits such as limiting salt, watching your weight, staying physically active, and limiting caffeine . When checking your blood pressure at home, do so at the same time each day, in a calm environment, with your feet flat on the floor and using the proper cuff size. Keep a log of your readings and bring both the log and your home cuff to your primary care visit scheduled for September 26. Your provider will also review baseline labs to monitor kidney and liver function, electrolytes, thyroid, and cholesterol.  Seek follow-up with your primary care provider as scheduled or sooner if your blood pressure remains consistently elevated despite medication. Go to the emergency department right away if you develop severe headache, chest pain, shortness of breath, vision changes, dizziness, or any sudden concerning symptoms.     ED Prescriptions     Medication Sig Dispense Auth. Provider   lisinopril -hydrochlorothiazide  (ZESTORETIC ) 10-12.5 MG tablet Take 1 tablet by mouth daily with breakfast. 14 tablet Devontay Celaya, FNP   butalbital -acetaminophen -caffeine  (FIORICET) 50-325-40 MG tablet Take 1 tablet by mouth every 6 (six) hours as needed for headache. Do not to exceed 6 tablets per 24 hour period 12 tablet Estephanie Hubbs, [place], FNP    potassium chloride  (KLOR-CON ) 10 MEQ tablet Take 1 tablet (10 mEq total) by mouth daily. 14 tablet Iola Lukes, FNP      PDMP not reviewed this encounter.   Iola Lukes, [place] 07/25/24 [phone]
